# Patient Record
Sex: Male | Born: 1985 | Race: White | Hispanic: No | Smoking: Never smoker
Health system: Southern US, Community
[De-identification: ages and names within clinical notes are randomized; demographics above are authoritative.]

---
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
## 2004-10-03 ENCOUNTER — Emergency Department (HOSPITAL_COMMUNITY): Admission: EM | Admit: 2004-10-03 | Discharge: 2004-10-03 | Payer: Self-pay | Admitting: Emergency Medicine

## 2006-08-28 IMAGING — CR DG HAND COMPLETE 3+V*R*
3 series · 3 of 3 positions shown · non-contrast
Comparison: None.

CLINICAL DATA: Trauma and pain.
 RIGHT WRIST ? 4 VIEW:

[view not recorded (1 of 3)]
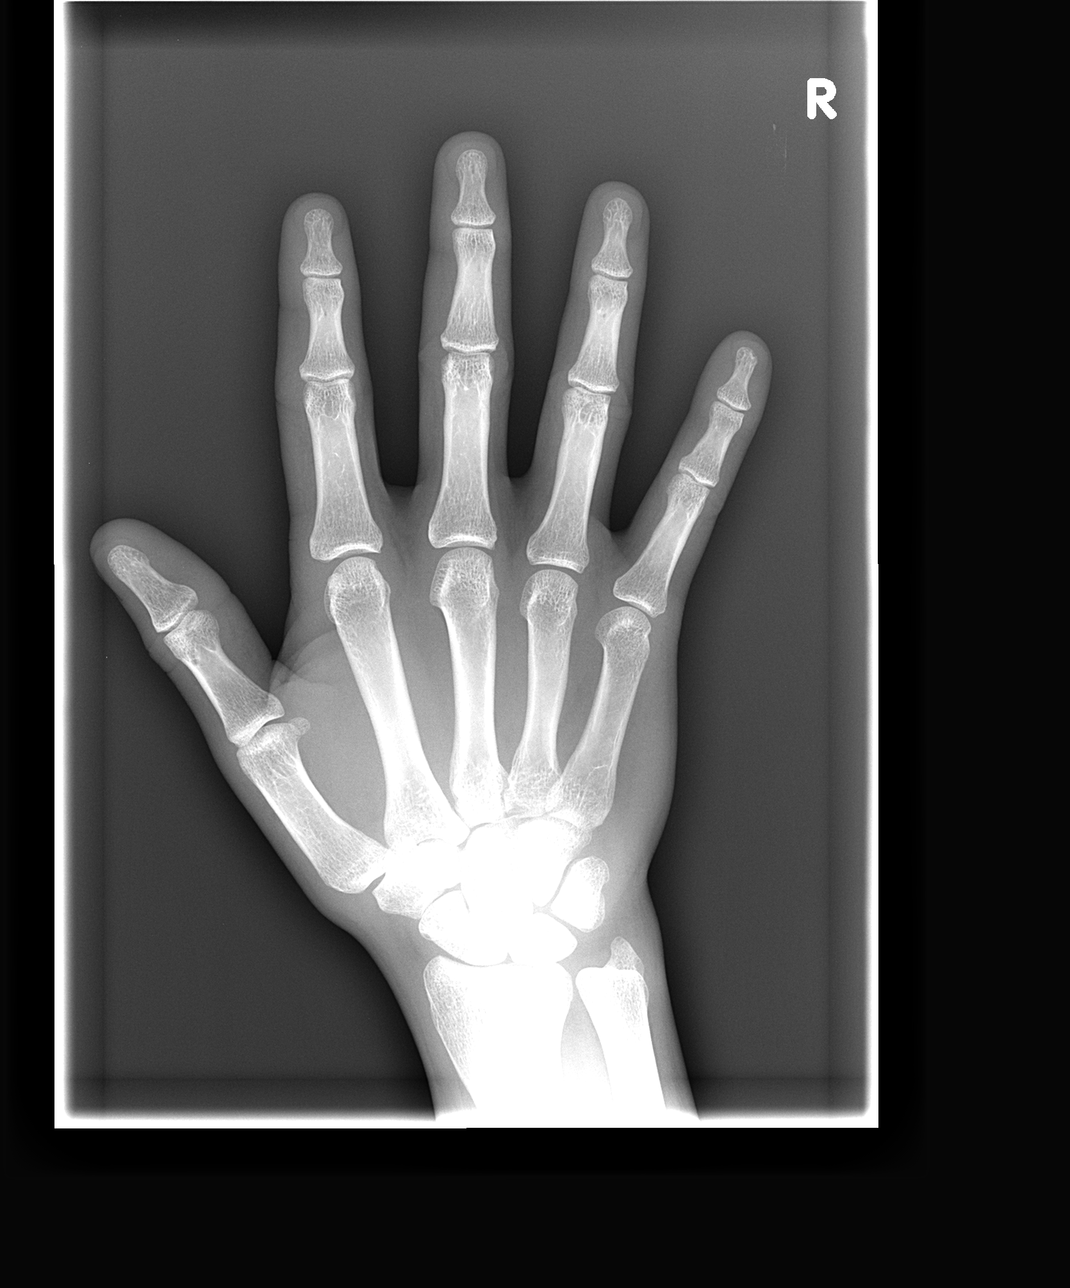

[view not recorded (2 of 3)]
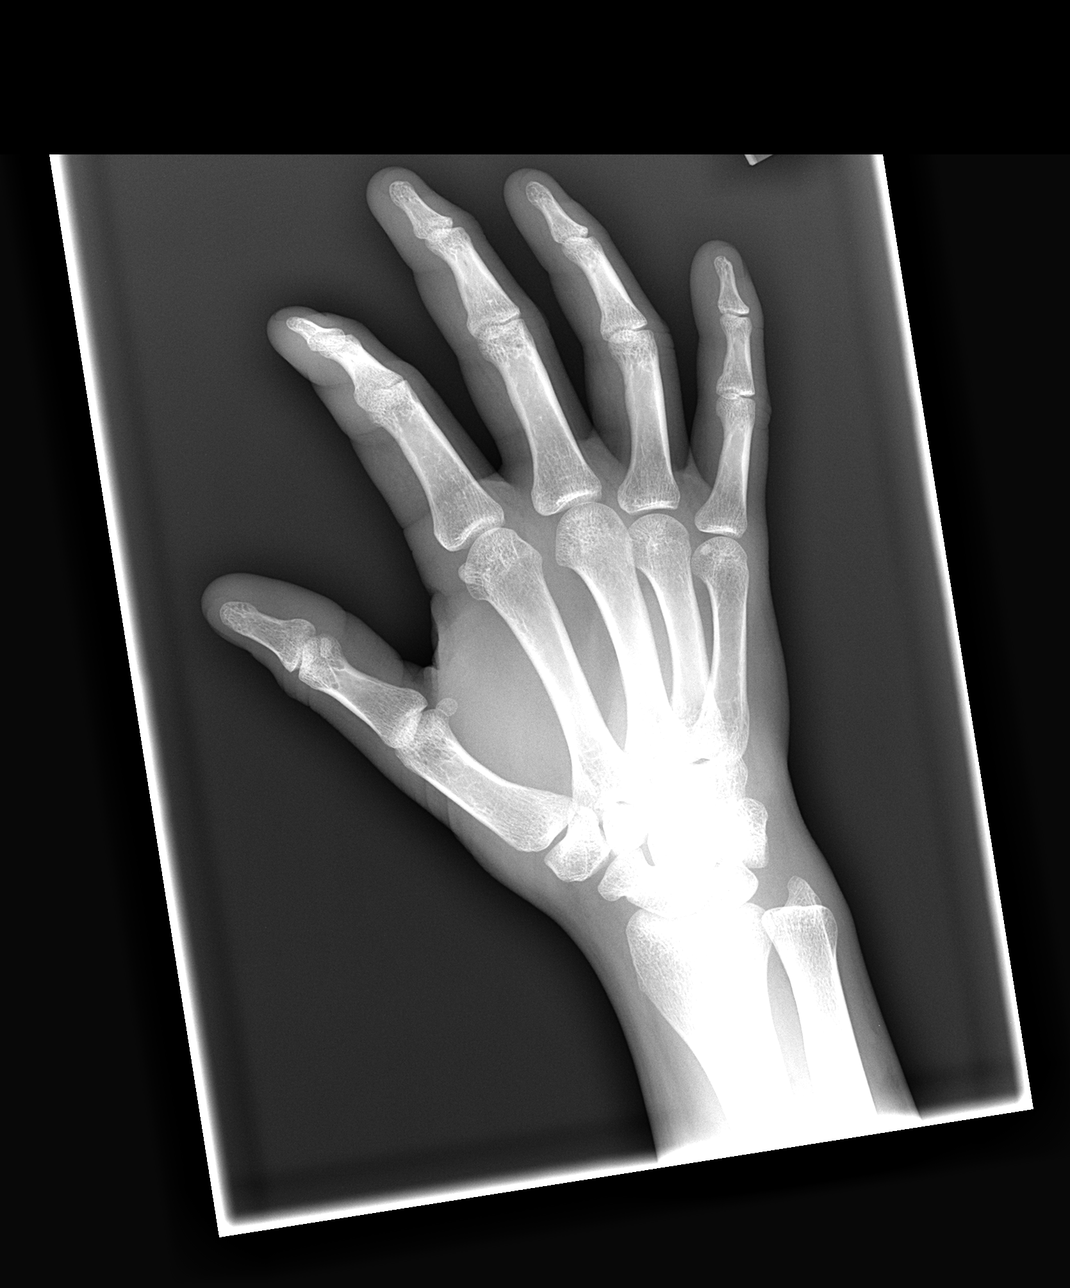

[view not recorded (3 of 3)]
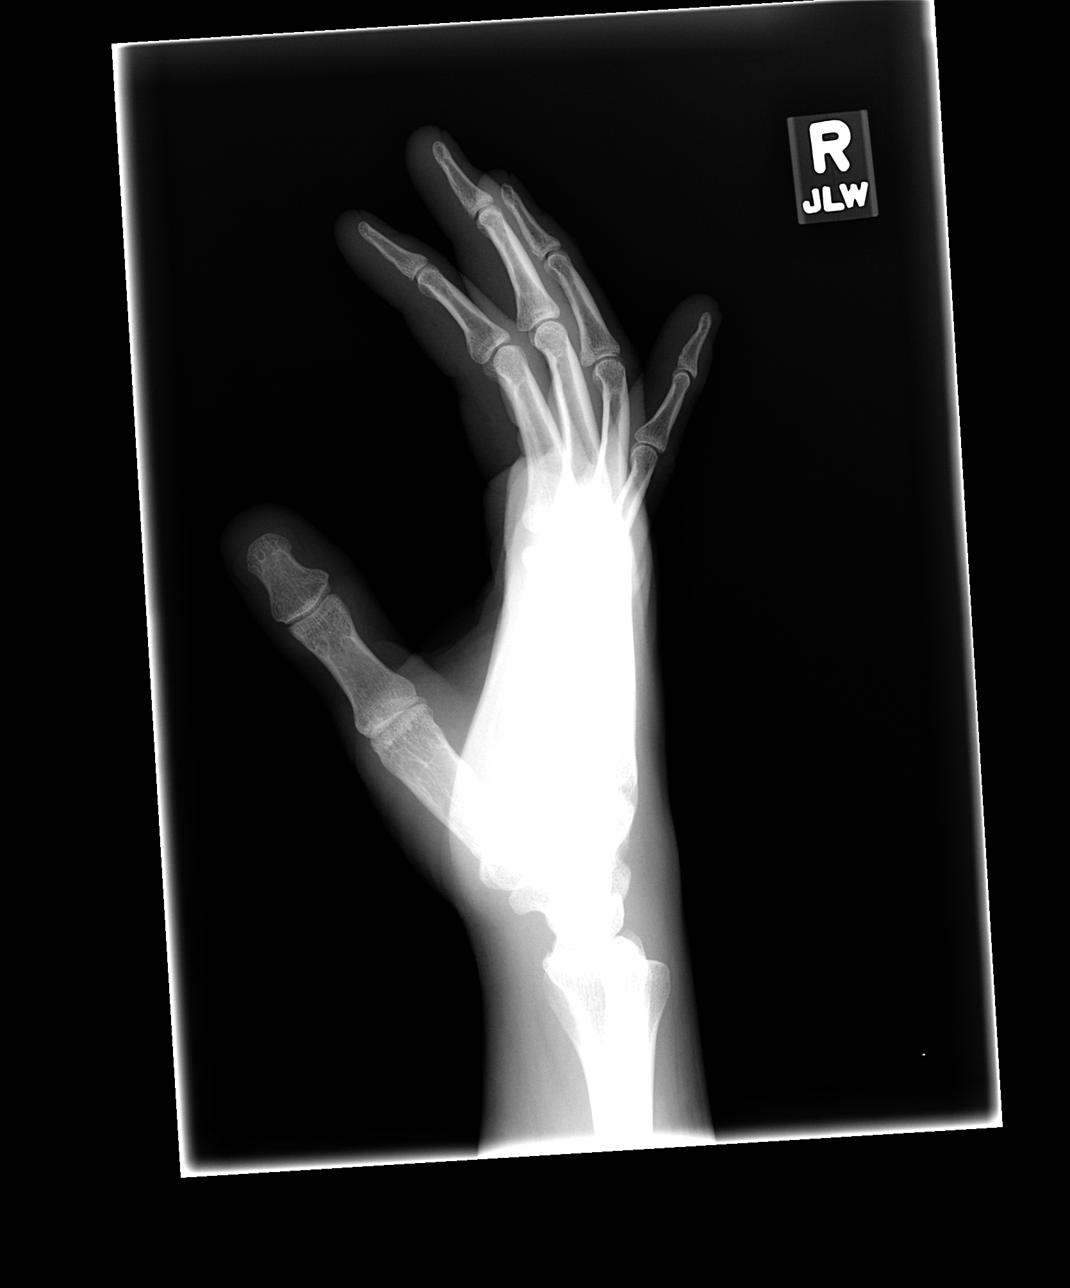

[3 of 3 positions shown; findings below may reference images not displayed]

FINDINGS: There is a subtle lucency through the proximal third of the scaphoid which is most well visualized on the first AP view.  This is also suggested on the dedicated scaphoid view.  Remainder of the wrist is intact.
IMPRESSION: Nondisplaced fracture through the proximal third of the scaphoid.  
 RIGHT HAND ? 3 VIEW
 The scaphoid fracture is again identified.  The remainder of the hand is intact.
IMPRESSION: Proximal scaphoid nondisplaced fracture.

## 2015-06-03 ENCOUNTER — Encounter (HOSPITAL_COMMUNITY): Payer: Self-pay | Admitting: *Deleted

## 2015-06-03 ENCOUNTER — Emergency Department (HOSPITAL_COMMUNITY)
Admission: EM | Admit: 2015-06-03 | Discharge: 2015-06-03 | Disposition: A | Discharge status: Home / Self Care | Payer: Self-pay | Attending: Emergency Medicine | Admitting: Emergency Medicine

## 2015-06-03 DIAGNOSIS — R197 Diarrhea, unspecified: Secondary | ICD-10-CM | POA: Insufficient documentation

## 2015-06-03 MED ORDER — DIPHENOXYLATE-ATROPINE 2.5-0.025 MG PO TABS: 2.0000 | ORAL_TABLET | Freq: Four times a day (QID) | ORAL | Status: DC | PRN

## 2015-06-03 NOTE — ED Notes (Signed)
Pt reports he has been having N/ and D/ for 48 hours. Reports 20 D/ stools in the past 24 hour, black in color. States "I drank a whole bottle of Pepto Bismol yesterday and took 2 Imodium". Denies V/.

## 2015-06-03 NOTE — ED Notes (Signed)
Pt c/o fever, abd soreness and diarrhea that started yesterday,

## 2015-06-03 NOTE — ED Provider Notes (Signed)
CSN: [id number]     Arrival date & time 06/03/15  0622 History   First MD Initiated Contact with Patient 06/03/15 0703     Chief Complaint  Patient presents with  . Diarrhea     (Consider location/radiation/quality/duration/timing/severity/associated sxs/prior Treatment) HPI   Gerald Smith is a 30 y.o. male who presents for evaluation of nausea and diarrhea for the last 2 days. He has had up to 20 stools each day. Stool is dark in color after taking Pepto-Bismol. No fever, chills, cough, shortness of breath, weakness or dizziness. He is a similar problem in the past when he traveled to [place]. No known abnormal food exposures or sick contacts. He has mild abdominal cramping. There are no other known modifying factors.   History reviewed. No pertinent past medical history. History reviewed. No pertinent past surgical history. No family history on file. Social History  Substance Use Topics  . Smoking status: Never Smoker   . Smokeless tobacco: None  . Alcohol Use: No    Review of Systems  All other systems reviewed and are negative.     Allergies  Review of patient's allergies indicates no known allergies.  Home Medications   Prior to Admission medications   Medication Sig Start Date End Date Taking? Authorizing Provider  diphenoxylate-atropine (LOMOTIL) 2.5-0.025 MG tablet Take 2 tablets by mouth 4 (four) times daily as needed for diarrhea or loose stools. 06/03/15   Mancel Bale, MD   BP 121/65 mmHg  Pulse 76  Temp(Src) 99.1 F (37.3 C) (Oral)  Resp 18  Ht  (1.778 m)  Wt 180 lb (81.647 kg)  BMI 25.83 kg/m2  SpO2 96% Physical Exam  Constitutional: He is oriented to person, place, and time. He appears well-developed and well-nourished.  HENT:  Head: Normocephalic and atraumatic.  Right Ear: External ear normal.  Left Ear: External ear normal.  Eyes: Conjunctivae and EOM are normal. Pupils are equal, round, and reactive to light.  Neck: Normal range of  motion and phonation normal. Neck supple.  Cardiovascular: Normal rate, regular rhythm and normal heart sounds.   Pulmonary/Chest: Effort normal and breath sounds normal. He exhibits no bony tenderness.  Abdominal: Soft. There is tenderness (Diffuse, mild).  Musculoskeletal: Normal range of motion.  Neurological: He is alert and oriented to person, place, and time. No cranial nerve deficit or sensory deficit. He exhibits normal muscle tone. Coordination normal.  Skin: Skin is warm, dry and intact.  Psychiatric: He has a normal mood and affect. His behavior is normal. Judgment and thought content normal.  Nursing note and vitals reviewed.   ED Course  Procedures (including critical care time)  Medications - No data to display  Patient Vitals for the past 24 hrs:  BP Temp Temp src Pulse Resp SpO2 Height Weight  06/03/15 0858 121/65 mmHg - - 76 18 96 % - -  06/03/15 0632 125/76 mmHg 99.1 F (37.3 C) Oral 76 18 97 %  (1.778 m) 180 lb (81.647 kg)    At discharge Reevaluation with update and discussion. After initial assessment and treatment, an updated evaluation reveals he remains comfortable. Findings discussed with patient, all questions answered. Rasool Rommel L    Labs Review Labs Reviewed  GASTROINTESTINAL PANEL BY PCR, STOOL (REPLACES STOOL CULTURE)    Imaging Review No results found. I have personally reviewed and evaluated these images and lab results as part of my medical decision-making.   EKG Interpretation None      MDM  Final diagnoses:  Diarrhea, unspecified type    Nonspecific diarrhea. Hemodynamically stable. No evidence for dehydration. Stool obtained for testing, required senduot, so is not available to time of discharge.  Nursing Notes Reviewed/ Care Coordinated Applicable Imaging Reviewed Interpretation of Laboratory Data incorporated into ED treatment  The patient appears reasonably screened and/or stabilized for discharge and I doubt any  other medical condition or other [facility] requiring further screening, evaluation, or treatment in the ED at this time prior to discharge.  Plan: Home Medications- Lomotil; Home Treatments- rest, fluids; return here if the recommended treatment, does not improve the symptoms; Recommended follow up- PCP when necessary     Mancel BaleElliott Analese Sovine, MD 06/03/15 1623

## 2015-06-03 NOTE — Discharge Instructions (Signed)
Continue to drink plenty of fluids to prevent dehydration. Your stool testing results will be back in one or 2 days.   Diarrhea Diarrhea is frequent loose and watery bowel movements. It can cause you to feel weak and dehydrated. Dehydration can cause you to become tired and thirsty, have a dry mouth, and have decreased urination that often is dark yellow. Diarrhea is a sign of another problem, most often an infection that will not last long. In most cases, diarrhea typically lasts 2-3 days. However, it can last longer if it is a sign of something more serious. It is important to treat your diarrhea as directed by your caregiver to lessen or prevent future episodes of diarrhea. CAUSES  Some common causes include:  Gastrointestinal infections caused by viruses, bacteria, or parasites.  Food poisoning or food allergies.  Certain medicines, such as antibiotics, chemotherapy, and laxatives.  Artificial sweeteners and fructose.  Digestive disorders. HOME CARE INSTRUCTIONS  Ensure adequate fluid intake (hydration): Have 1 cup (8 oz) of fluid for each diarrhea episode. Avoid fluids that contain simple sugars or sports drinks, fruit juices, whole milk products, and sodas. Your urine should be clear or pale yellow if you are drinking enough fluids. Hydrate with an oral rehydration solution that you can purchase at pharmacies, retail stores, and online. You can prepare an oral rehydration solution at home by mixing the following ingredients together:   - tsp table salt.   tsp baking soda.   tsp salt substitute containing potassium chloride.  1  tablespoons sugar.  1 L (34 oz) of water.  Certain foods and beverages may increase the speed at which food moves through the gastrointestinal (GI) tract. These foods and beverages should be avoided and include:  Caffeinated and alcoholic beverages.  High-fiber foods, such as raw fruits and vegetables, nuts, seeds, and whole grain breads and  cereals.  Foods and beverages sweetened with sugar alcohols, such as xylitol, sorbitol, and mannitol.  Some foods may be well tolerated and may help thicken stool including:  Starchy foods, such as rice, toast, pasta, low-sugar cereal, oatmeal, grits, baked potatoes, crackers, and bagels.  Bananas.  Applesauce.  Add probiotic-rich foods to help increase healthy bacteria in the GI tract, such as yogurt and fermented milk products.  Wash your hands well after each diarrhea episode.  Only take over-the-counter or prescription medicines as directed by your caregiver.  Take a warm bath to relieve any burning or pain from frequent diarrhea episodes. SEEK IMMEDIATE MEDICAL CARE IF:   You are unable to keep fluids down.  You have persistent vomiting.  You have blood in your stool, or your stools are black and tarry.  You do not urinate in 6-8 hours, or there is only a small amount of very dark urine.  You have abdominal pain that increases or localizes.  You have weakness, dizziness, confusion, or light-headedness.  You have a severe headache.  Your diarrhea gets worse or does not get better.  You have a fever or persistent symptoms for more than 2-3 days.  You have a fever and your symptoms suddenly get worse. MAKE SURE YOU:   Understand these instructions.  Will watch your condition.  Will get help right away if you are not doing well or get worse.   This information is not intended to replace advice given to you by your health care provider. Make sure you discuss any questions you have with your health care provider.   Document Released: 12/20/2001 Document Revised:  01/20/2014 Document Reviewed: 09/07/2011 Elsevier Interactive Patient Education [employer].

## 2015-06-03 NOTE — ED Notes (Signed)
Informed by lab that there is not enough stool specimen for an ova and parasite exam. Patient has been discharged. Dr Effie ShyWentz notified, no further action needed to be taken at this time per Dr Effie ShyWentz.

## 2015-06-04 LAB — GASTROINTESTINAL PANEL BY PCR, STOOL (REPLACES STOOL CULTURE)
ADENOVIRUS F40/41: NOT DETECTED
ASTROVIRUS: NOT DETECTED
CYCLOSPORA CAYETANENSIS: NOT DETECTED
Campylobacter species: NOT DETECTED
Cryptosporidium: NOT DETECTED
E. coli O157: NOT DETECTED
ENTAMOEBA HISTOLYTICA: NOT DETECTED
ENTEROPATHOGENIC E COLI (EPEC): NOT DETECTED
ENTEROTOXIGENIC E COLI (ETEC): NOT DETECTED
Enteroaggregative E coli (EAEC): NOT DETECTED
Giardia lamblia: NOT DETECTED
Norovirus GI/GII: NOT DETECTED
Plesimonas shigelloides: NOT DETECTED
Rotavirus A: NOT DETECTED
Salmonella species: NOT DETECTED
Sapovirus (I, II, IV, and V): NOT DETECTED
Shiga like toxin producing E coli (STEC): NOT DETECTED
Shigella/Enteroinvasive E coli (EIEC): NOT DETECTED
VIBRIO CHOLERAE: NOT DETECTED
VIBRIO SPECIES: NOT DETECTED
Yersinia enterocolitica: NOT DETECTED

## 2018-09-06 ENCOUNTER — Emergency Department (HOSPITAL_COMMUNITY): Payer: BC Managed Care – PPO

## 2018-09-06 ENCOUNTER — Encounter (HOSPITAL_COMMUNITY): Payer: Self-pay | Admitting: Emergency Medicine

## 2018-09-06 ENCOUNTER — Other Ambulatory Visit: Payer: Self-pay

## 2018-09-06 ENCOUNTER — Emergency Department (HOSPITAL_COMMUNITY)
Admission: EM | Admit: 2018-09-06 | Discharge: 2018-09-07 | Disposition: A | Discharge status: Home / Self Care | Payer: BC Managed Care – PPO | Attending: Emergency Medicine | Admitting: Emergency Medicine

## 2018-09-06 ENCOUNTER — Ambulatory Visit (INDEPENDENT_AMBULATORY_CARE_PROVIDER_SITE_OTHER)
Admission: EM | Admit: 2018-09-06 | Discharge: 2018-09-06 | Disposition: A | Discharge status: Home / Self Care | Payer: BC Managed Care – PPO | Source: Home / Self Care

## 2018-09-06 DIAGNOSIS — R102 Pelvic and perineal pain unspecified side: Secondary | ICD-10-CM

## 2018-09-06 DIAGNOSIS — N50811 Right testicular pain: Secondary | ICD-10-CM | POA: Diagnosis not present

## 2018-09-06 LAB — URINALYSIS, ROUTINE W REFLEX MICROSCOPIC
Bilirubin Urine: NEGATIVE
Glucose, UA: NEGATIVE mg/dL
Hgb urine dipstick: NEGATIVE
Ketones, ur: NEGATIVE mg/dL
Leukocytes,Ua: NEGATIVE
Nitrite: NEGATIVE
Protein, ur: NEGATIVE mg/dL
Specific Gravity, Urine: 1.02 (ref 1.005–1.030)
pH: 6 (ref 5.0–8.0)

## 2018-09-06 NOTE — ED Triage Notes (Signed)
Pt went to [facility] and was sent for further evaluation of right testicle pain starting yesterday.

## 2018-09-06 NOTE — Discharge Instructions (Signed)
°  Please go to [facility] for further evaluation and treatment of your symptoms.

## 2018-09-06 NOTE — ED Triage Notes (Signed)
Pt developed testicular pain suddenly yesterday , denies injury, pain is better today

## 2018-09-06 NOTE — ED Provider Notes (Signed)
[facility]    CSN: [id number] Arrival date & time: 09/06/18  1439      History   Chief Complaint No chief complaint on file.   HPI Gerald Smith is a 33 y.o. male.   HPI  Gerald Smith is a 33 y.o. male presenting to UC with c/o sudden onset Right side testicular pain that started yesterday after sitting in the car with his girlfriend. Pain was severe yesterday but has eased up a lot today. Pain still present and worsens when his pants rub against his scrotum.  Pain is aching 5/10 at this time. He has mild nausea yesterday with the severe pain.  "It felt like I was hit in the groin, like when I play soccer" but pt denies recent injury.  Denies pain with urination, hematuria or penile discharge. Denies concern for STIs.   History reviewed. No pertinent past medical history.  There are no active problems to display for this patient.   History reviewed. No pertinent surgical history.     Home Medications    Prior to Admission medications   Medication Sig Start Date End Date Taking? Authorizing Provider  diphenoxylate-atropine (LOMOTIL) 2.5-0.025 MG tablet Take 2 tablets by mouth 4 (four) times daily as needed for diarrhea or loose stools. 06/03/15   Mancel BaleWentz, Elliott, MD    Family History History reviewed. No pertinent family history.  Social History Social History   Tobacco Use  . Smoking status: Never Smoker  Substance Use Topics  . Alcohol use: No  . Drug use: No     Allergies   Patient has no known allergies.   Review of Systems Review of Systems  Constitutional: Negative for chills and fever.  Gastrointestinal: Positive for nausea. Negative for abdominal pain, diarrhea and vomiting.  Genitourinary: Positive for testicular pain (Right). Negative for discharge, dysuria, flank pain, frequency, genital sores, hematuria, penile pain and scrotal swelling.  Musculoskeletal: Negative for back pain and myalgias.  Skin: Negative for color change and  wound.     Physical Exam Triage Vital Signs ED Triage Vitals  Enc Vitals Group     BP 09/06/18 1508 136/82     Pulse Rate 09/06/18 1508 66     Resp 09/06/18 1508 18     Temp 09/06/18 1508 98.6 F (37 C)     Temp src --      SpO2 09/06/18 1508 96 %     Weight --      Height --      Head Circumference --      Peak Flow --      Pain Score 09/06/18 1506 5     Pain Loc --      Pain Edu? --      Excl. in GC? --    No data found.  Updated Vital Signs BP 136/82   Pulse 66   Temp 98.6 F (37 C)   Resp 18   SpO2 96%   Visual Acuity Right Eye Distance:   Left Eye Distance:   Bilateral Distance:    Right Eye Near:   Left Eye Near:    Bilateral Near:     Physical Exam Vitals signs and nursing note reviewed. Exam conducted with a chaperone present.  Constitutional:      Appearance: Normal appearance. He is well-developed.  HENT:     Head: Normocephalic and atraumatic.  Neck:     Musculoskeletal: Normal range of motion.  Cardiovascular:     Rate  and Rhythm: Normal rate and regular rhythm.  Pulmonary:     Effort: Pulmonary effort is normal.     Breath sounds: Normal breath sounds.  Abdominal:     General: There is no distension.     Palpations: Abdomen is soft.     Tenderness: There is no abdominal tenderness. There is no right CVA tenderness or left CVA tenderness.  Genitourinary:    Penis: Normal and uncircumcised.      Scrotum/Testes:        Right: Tenderness present. Mass or swelling not present.        Left: Mass, tenderness or swelling not present.     Epididymis:     Right: Tenderness present. No mass.     Left: Normal.  Musculoskeletal: Normal range of motion.  Skin:    General: Skin is warm and dry.  Neurological:     Mental Status: He is alert and oriented to person, place, and time.  Psychiatric:        Behavior: Behavior normal.      UC Treatments / Results  Labs (all labs ordered are listed, but only abnormal results are displayed) Labs  Reviewed - No data to display  EKG   Radiology No results found.  Procedures Procedures (including critical care time)  Medications Ordered in UC Medications - No data to display  Initial Impression / Assessment and Plan / UC Course  I have reviewed the triage vital signs and the nursing notes.  Pertinent labs & imaging results that were available during my care of the patient were reviewed by me and considered in my medical decision making (see chart for details).     Due to right side tenderness and no concern for STIs, recommend pt be evaluated further in emergency department for possible ultrasound to r/o torsion  Pt understanding and agreeable with plan.   Final Clinical Impressions(s) / UC Diagnoses   Final diagnoses:  Right testicular pain     Discharge Instructions      Please go to [facility] for further evaluation and treatment of your symptoms.     ED Prescriptions    None     Controlled Substance Prescriptions [place] Controlled Substance Registry consulted? Not Applicable   Tyrell Antonio 09/06/18 5277

## 2018-09-07 MED ORDER — IBUPROFEN 800 MG PO TABS: 800.0000 mg | ORAL_TABLET | Freq: Three times a day (TID) | ORAL | 0 refills | Status: AC

## 2018-09-07 MED ORDER — IBUPROFEN 800 MG PO TABS
800.0000 mg | ORAL_TABLET | Freq: Once | ORAL | Status: AC
  Administered 2018-09-07: 800 mg via ORAL
  Filled 2018-09-07: qty 1

## 2018-09-07 NOTE — ED Provider Notes (Signed)
[facility] EMERGENCY DEPARTMENT Provider Note   CSN: [id number] Arrival date & time: 09/06/18  1641     History   Chief Complaint Chief Complaint  Patient presents with  . Testicle Pain    HPI Gerald Smith is a 33 y.o. male presenting with a 2 day history of right testicular pain. He describes a sudden onset right testicular stabbing needle like pain which was fleeting, and since the event has had a deep achy pressure sensation in the right testicle.  The needle pain initially radiated to his right groin, but now localizes in the right testicle.  He denies dysuria, hematuria, back or flank pain, has no penile dc, no risk factors for std's.  Also denies trauma or swelling.  Pain is triggered by touch but improving. No rash or skin changes.  He was seen at a local urgent care and referred here to r/o torsion.  Has taken tylenol with minimal relief.     The history is provided by the patient.    History reviewed. No pertinent past medical history.  There are no active problems to display for this patient.   History reviewed. No pertinent surgical history.      Home Medications    Prior to Admission medications   Medication Sig Start Date End Date Taking? Authorizing Provider  acetaminophen (TYLENOL) 500 MG tablet Take 500 mg by mouth every 6 (six) hours as needed.   Yes [provider]  ibuprofen (ADVIL) 800 MG tablet Take 1 tablet (800 mg total) by mouth 3 (three) times daily. 09/07/18   Evalee Jefferson, PA-C    Family History History reviewed. No pertinent family history.  Social History Social History   Tobacco Use  . Smoking status: Never Smoker  Substance Use Topics  . Alcohol use: No  . Drug use: No     Allergies   Patient has no known allergies.   Review of Systems Review of Systems  Constitutional: Negative for fever.  HENT: Negative for congestion and sore throat.   Eyes: Negative.   Respiratory: Negative for chest tightness and shortness of  breath.   Cardiovascular: Negative for chest pain.  Gastrointestinal: Negative for abdominal pain, nausea and vomiting.  Genitourinary: Positive for testicular pain. Negative for discharge, dysuria, frequency, hematuria, penile pain and scrotal swelling.  Musculoskeletal: Negative for arthralgias, joint swelling and neck pain.  Skin: Negative.  Negative for rash and wound.  Neurological: Negative for dizziness and headaches.  Psychiatric/Behavioral: Negative.      Physical Exam Updated Vital Signs BP (!) 137/91 (BP Location: Right Arm)   Pulse (!) 59   Temp 98.3 F (36.8 C) (Oral)   Resp 17   Ht 5\' 10"  (1.778 m)   Wt 86.2 kg   SpO2 99%   BMI 27.26 kg/m   Physical Exam Vitals signs and nursing note reviewed. Exam conducted with a chaperone present.  Constitutional:      Appearance: He is well-developed.  HENT:     Head: Normocephalic and atraumatic.  Eyes:     Conjunctiva/sclera: Conjunctivae normal.  Neck:     Musculoskeletal: Normal range of motion.  Cardiovascular:     Rate and Rhythm: Normal rate and regular rhythm.     Heart sounds: Normal heart sounds.  Pulmonary:     Effort: Pulmonary effort is normal.     Breath sounds: Normal breath sounds. No wheezing.  Abdominal:     General: Bowel sounds are normal.     Palpations: Abdomen  is soft. There is no mass.     Tenderness: There is no abdominal tenderness.     Hernia: No hernia is present. There is no hernia in the right inguinal area.  Genitourinary:    Penis: Uncircumcised.      Scrotum/Testes: Normal. Cremasteric reflex is present.        Right: Mass, tenderness, swelling, testicular hydrocele or varicocele not present.     Epididymis:     Right: Normal. No tenderness.  Musculoskeletal: Normal range of motion.  Lymphadenopathy:     Lower Body: No right inguinal adenopathy.  Skin:    General: Skin is warm and dry.  Neurological:     Mental Status: He is alert.      ED Treatments / Results  Labs  (all labs ordered are listed, but only abnormal results are displayed) Labs Reviewed  URINALYSIS, ROUTINE W REFLEX MICROSCOPIC  GC/CHLAMYDIA PROBE AMP ([employer]) NOT AT [facility]    EKG None  Radiology [place] Scrotum W/doppler  Result Date: 09/06/2018 CLINICAL DATA:  Pelvic pain, sudden onset of RIGHT scrotal pain with nausea EXAM: SCROTAL ULTRASOUND DOPPLER ULTRASOUND OF THE TESTICLES TECHNIQUE: Complete ultrasound examination of the testicles, epididymis, and other scrotal structures was performed. Color and spectral Doppler ultrasound were also utilized to evaluate blood flow to the testicles. COMPARISON:  None FINDINGS: Right testicle Measurements: 5.9 x 2.1 x 3.4 cm. Normal morphology without mass. Two small microcalcifications identified. Internal blood flow present on color Doppler imaging. Left testicle Measurements: 5.2 x 2.9 x 3.1 cm. Normal morphology without mass or calcification. Internal blood flow present on color Doppler imaging, minimally greater than on RIGHT. Right epididymis: Tubular ectasia of the RIGHT epididymis noted. No focal mass. Left epididymis: Tubular ectasia of the LEFT epididymis noted. No focal mass. Hydrocele:  Absent bilaterally Varicocele:  Absent bilaterally Pulsed Doppler interrogation of both testes demonstrates normal low resistance arterial and venous waveforms bilaterally. IMPRESSION: Tubular ectasia of the epididymi bilaterally. No evidence of testicular mass or torsion. Electronically Signed   By: Ulyses SouthwardMark  Boles M.D.   On: 09/06/2018 17:16    Procedures Procedures (including critical care time)  Medications Ordered in ED Medications  ibuprofen (ADVIL) tablet 800 mg (800 mg Oral Given 09/07/18 0021)     Initial Impression / Assessment and Plan / ED Course  I have reviewed the triage vital signs and the nursing notes.  Pertinent labs & imaging results that were available during my care of the patient were reviewed by me and considered in my medical decision  making (see chart for details).        US and labs reviewed, discussed with pt.  Bilateral tubular ectasia of unclear significance.  He has not had a vasectomy, so not a post op change.  No torsion per US.  Pain is greatly improved since first onset ytd am.  Encouraged ibuprofen in place of tylenol for sx relief. Referred to urology for f/u care.  GC/chlamydia added to UA, but doubt infection as source.  Final Clinical Impressions(s) / ED Diagnoses   Final diagnoses:  Pain in right testicle    ED Discharge Orders         Ordered    ibuprofen (ADVIL) 800 MG tablet  3 times daily     09/07/18 0007           Burgess Amordol, Chelisa Hennen, PA-C 09/07/18 1225    Donnetta Hutchingook, Brian, MD 09/07/18 2044

## 2018-09-07 NOTE — Discharge Instructions (Signed)
Your urinalysis is normal without sign of infection and your ultrasound does not show any

## 2018-09-21 ENCOUNTER — Ambulatory Visit (INDEPENDENT_AMBULATORY_CARE_PROVIDER_SITE_OTHER): Payer: BC Managed Care – PPO | Admitting: Urology

## 2018-09-21 DIAGNOSIS — N509 Disorder of male genital organs, unspecified: Secondary | ICD-10-CM

## 2023-03-21 ENCOUNTER — Other Ambulatory Visit: Payer: Self-pay

## 2023-03-21 ENCOUNTER — Ambulatory Visit
Admission: EM | Admit: 2023-03-21 | Discharge: 2023-03-21 | Disposition: A | Discharge status: Home / Self Care | Payer: Self-pay | Attending: Family Medicine | Admitting: Family Medicine

## 2023-03-21 DIAGNOSIS — A630 Anogenital (venereal) warts: Secondary | ICD-10-CM

## 2023-03-21 MED ORDER — IMIQUIMOD 5 % EX CREA: TOPICAL_CREAM | CUTANEOUS | 1 refills | Status: AC

## 2023-03-21 NOTE — Discharge Instructions (Addendum)
 Advised patient to apply imiquimod/Aldara 5% cream as directed.  Advised if symptoms worsen and/or unresolved please follow-up with PCP or urology for further evaluation.  Contact information has been provided for [facility] and Urology with his AVS today.

## 2023-03-21 NOTE — ED Triage Notes (Signed)
 Pt presents to uc with lesions to genital area. Pt reports he noticed these 1 week ago and denies any discharge from penis and pain during urination

## 2023-03-21 NOTE — ED Provider Notes (Signed)
 [facility]    CSN: [id number] Arrival date & time: 03/21/23  1451      History   Chief Complaint Chief Complaint  Patient presents with   lesion to genital area    HPI Gerald Smith is a 38 y.o. male.   HPI Pleasant 38 year old male presents with lesions of genital area that he noticed 1 week ago.  Patient denies penile or testicular pain.  Denies pain with urination and/or ejaculation.  History reviewed. No pertinent past medical history.  There are no active problems to display for this patient.   History reviewed. No pertinent surgical history.     Home Medications    Prior to Admission medications   Medication Sig Start Date End Date Taking? Authorizing Provider  imiquimod (ALDARA) 5 % cream Apply topically to areas of penis 3 times per week at bedtime for a total of 16 weeks 03/21/23  Yes Trevor Iha, FNP  acetaminophen (TYLENOL) 500 MG tablet Take 500 mg by mouth every 6 (six) hours as needed.    [provider]  ibuprofen (ADVIL) 800 MG tablet Take 1 tablet (800 mg total) by mouth 3 (three) times daily. 09/07/18   Burgess Amor, PA-C    Family History Family History  Problem Relation Age of Onset   Healthy Mother    Healthy Father     Social History Social History   Tobacco Use   Smoking status: Never  Substance Use Topics   Alcohol use: No   Drug use: No     Allergies   Patient has no known allergies.   Review of Systems Review of Systems  Genitourinary:        Genital warts noticed 1 week ago per patient     Physical Exam Triage Vital Signs ED Triage Vitals  Encounter Vitals Group     BP      Systolic BP Percentile      Diastolic BP Percentile      Pulse      Resp      Temp      Temp src      SpO2      Weight      Height      Head Circumference      Peak Flow      Pain Score      Pain Loc      Pain Education      Exclude from Growth Chart    No data found.  Updated Vital Signs BP (!) 171/92    Pulse 68   Temp 97.8 F (36.6 C)   Resp 20   SpO2 98%    Physical Exam Vitals and nursing note reviewed.  Constitutional:      Appearance: Normal appearance. He is normal weight.  HENT:     Head: Normocephalic and atraumatic.     Mouth/Throat:     Mouth: Mucous membranes are moist.     Pharynx: Oropharynx is clear.  Eyes:     Extraocular Movements: Extraocular movements intact.     Conjunctiva/sclera: Conjunctivae normal.     Pupils: Pupils are equal, round, and reactive to light.  Cardiovascular:     Rate and Rhythm: Normal rate and regular rhythm.     Pulses: Normal pulses.     Heart sounds: Normal heart sounds.  Pulmonary:     Effort: Pulmonary effort is normal.     Breath sounds: Normal breath sounds. No wheezing, rhonchi or rales.  Genitourinary:  Penis: Normal.      Testes: Normal.     Comments: Fleshy colored papule noted over posterior portion of glans Musculoskeletal:        General: Normal range of motion.     Cervical back: Normal range of motion and neck supple.  Skin:    General: Skin is warm and dry.  Neurological:     General: No focal deficit present.     Mental Status: He is alert and oriented to person, place, and time. Mental status is at baseline.  Psychiatric:        Mood and Affect: Mood normal.        Behavior: Behavior normal.      UC Treatments / Results  Labs (all labs ordered are listed, but only abnormal results are displayed) Labs Reviewed - No data to display  EKG   Radiology No results found.  Procedures Procedures (including critical care time)  Medications Ordered in UC Medications - No data to display  Initial Impression / Assessment and Plan / UC Course  I have reviewed the triage vital signs and the nursing notes.  Pertinent labs & imaging results that were available during my care of the patient were reviewed by me and considered in my medical decision making (see chart for details).     MDM: 1.  Genital  warts-Rx'd Aldara 5% cream: Apply topically to area of penis 3 times per week at bedtime for a total of 16 weeks. Advised patient to apply imiquimod/Aldara 5% cream as directed.  Advised if symptoms worsen and/or unresolved please follow-up with PCP or urology for further evaluation.  Contact information has been provided for [facility] and Urology with his AVS today. Final Clinical Impressions(s) / UC Diagnoses   Final diagnoses:  Genital warts     Discharge Instructions      Advised patient to apply imiquimod/Aldara 5% cream as directed.  Advised if symptoms worsen and/or unresolved please follow-up with PCP or urology for further evaluation.  Contact information has been provided for [facility] and Urology with his AVS today.     ED Prescriptions     Medication Sig Dispense Auth. Provider   imiquimod (ALDARA) 5 % cream Apply topically to areas of penis 3 times per week at bedtime for a total of 16 weeks 24 each Trevor Iha, FNP      PDMP not reviewed this encounter.   Trevor Iha, FNP 03/21/23 1534
